# Patient Record
Sex: Female | Born: 1965 | Race: White | Hispanic: No | Marital: Married | Smoking: Never smoker
Health system: Southern US, Community
[De-identification: ages and names within clinical notes are randomized; demographics above are authoritative.]

## PROBLEM LIST (undated history)

## (undated) DIAGNOSIS — T7840XA Allergy, unspecified, initial encounter: Secondary | ICD-10-CM

## (undated) DIAGNOSIS — J45909 Unspecified asthma, uncomplicated: Secondary | ICD-10-CM

## (undated) HISTORY — DX: Allergy, unspecified, initial encounter: T78.40XA

## (undated) HISTORY — PX: DILATION AND CURETTAGE OF UTERUS: SHX78

## (undated) HISTORY — DX: Unspecified asthma, uncomplicated: J45.909

---

## 2000-12-19 ENCOUNTER — Other Ambulatory Visit: Admission: RE | Admit: 2000-12-19 | Discharge: 2000-12-19 | Payer: Self-pay | Admitting: Obstetrics and Gynecology

## 2001-12-24 ENCOUNTER — Other Ambulatory Visit: Admission: RE | Admit: 2001-12-24 | Discharge: 2001-12-24 | Payer: Self-pay | Admitting: Obstetrics and Gynecology

## 2002-07-29 ENCOUNTER — Other Ambulatory Visit: Admission: RE | Admit: 2002-07-29 | Discharge: 2002-07-29 | Payer: Self-pay | Admitting: Obstetrics and Gynecology

## 2002-12-03 ENCOUNTER — Encounter: Admission: RE | Admit: 2002-12-03 | Discharge: 2002-12-03 | Payer: Self-pay | Admitting: Obstetrics and Gynecology

## 2002-12-03 ENCOUNTER — Encounter: Payer: Self-pay | Admitting: Obstetrics and Gynecology

## 2003-01-27 ENCOUNTER — Other Ambulatory Visit: Admission: RE | Admit: 2003-01-27 | Discharge: 2003-01-27 | Payer: Self-pay | Admitting: Obstetrics and Gynecology

## 2005-02-18 ENCOUNTER — Encounter: Admission: RE | Admit: 2005-02-18 | Discharge: 2005-05-19 | Payer: Self-pay | Admitting: Psychiatry

## 2005-04-11 ENCOUNTER — Emergency Department: Payer: Self-pay | Admitting: Emergency Medicine

## 2007-10-08 ENCOUNTER — Encounter: Admission: RE | Admit: 2007-10-08 | Discharge: 2007-10-08 | Payer: Self-pay | Admitting: Obstetrics and Gynecology

## 2008-10-28 ENCOUNTER — Encounter: Admission: RE | Admit: 2008-10-28 | Discharge: 2008-10-28 | Payer: Self-pay | Admitting: Obstetrics and Gynecology

## 2009-11-02 ENCOUNTER — Encounter: Admission: RE | Admit: 2009-11-02 | Discharge: 2009-11-02 | Payer: Self-pay | Admitting: Obstetrics and Gynecology

## 2009-11-09 ENCOUNTER — Encounter: Admission: RE | Admit: 2009-11-09 | Discharge: 2009-11-09 | Payer: Self-pay | Admitting: Obstetrics and Gynecology

## 2010-09-26 ENCOUNTER — Encounter: Payer: Self-pay | Admitting: Obstetrics and Gynecology

## 2010-10-14 ENCOUNTER — Other Ambulatory Visit: Payer: Self-pay | Admitting: Obstetrics & Gynecology

## 2010-10-14 DIAGNOSIS — Z1231 Encounter for screening mammogram for malignant neoplasm of breast: Secondary | ICD-10-CM

## 2010-11-08 ENCOUNTER — Ambulatory Visit
Admission: RE | Admit: 2010-11-08 | Discharge: 2010-11-08 | Disposition: A | Discharge status: Home / Self Care | Payer: BC Managed Care – PPO | Source: Ambulatory Visit | Attending: Obstetrics & Gynecology | Admitting: Obstetrics & Gynecology

## 2010-11-08 DIAGNOSIS — Z1231 Encounter for screening mammogram for malignant neoplasm of breast: Secondary | ICD-10-CM

## 2011-10-17 ENCOUNTER — Other Ambulatory Visit: Payer: Self-pay | Admitting: Obstetrics & Gynecology

## 2011-10-17 DIAGNOSIS — Z1231 Encounter for screening mammogram for malignant neoplasm of breast: Secondary | ICD-10-CM

## 2011-11-11 ENCOUNTER — Ambulatory Visit
Admission: RE | Admit: 2011-11-11 | Discharge: 2011-11-11 | Disposition: A | Discharge status: Home / Self Care | Payer: BC Managed Care – PPO | Source: Ambulatory Visit | Attending: Obstetrics & Gynecology | Admitting: Obstetrics & Gynecology

## 2011-11-11 DIAGNOSIS — Z1231 Encounter for screening mammogram for malignant neoplasm of breast: Secondary | ICD-10-CM

## 2012-10-08 ENCOUNTER — Other Ambulatory Visit: Payer: Self-pay | Admitting: Obstetrics & Gynecology

## 2012-10-08 DIAGNOSIS — Z1231 Encounter for screening mammogram for malignant neoplasm of breast: Secondary | ICD-10-CM

## 2012-11-12 ENCOUNTER — Ambulatory Visit
Admission: RE | Admit: 2012-11-12 | Discharge: 2012-11-12 | Disposition: A | Discharge status: Home / Self Care | Payer: BC Managed Care – PPO | Source: Ambulatory Visit | Attending: Obstetrics & Gynecology | Admitting: Obstetrics & Gynecology

## 2012-11-12 DIAGNOSIS — Z1231 Encounter for screening mammogram for malignant neoplasm of breast: Secondary | ICD-10-CM

## 2013-10-10 ENCOUNTER — Other Ambulatory Visit: Payer: Self-pay

## 2013-10-10 DIAGNOSIS — Z1231 Encounter for screening mammogram for malignant neoplasm of breast: Secondary | ICD-10-CM

## 2013-11-27 ENCOUNTER — Ambulatory Visit
Admission: RE | Admit: 2013-11-27 | Discharge: 2013-11-27 | Disposition: A | Discharge status: Home / Self Care | Payer: Self-pay | Source: Ambulatory Visit

## 2013-11-27 DIAGNOSIS — Z1231 Encounter for screening mammogram for malignant neoplasm of breast: Secondary | ICD-10-CM

## 2014-10-21 ENCOUNTER — Other Ambulatory Visit: Payer: Self-pay

## 2014-10-21 DIAGNOSIS — Z1231 Encounter for screening mammogram for malignant neoplasm of breast: Secondary | ICD-10-CM

## 2014-12-10 ENCOUNTER — Ambulatory Visit
Admission: RE | Admit: 2014-12-10 | Discharge: 2014-12-10 | Disposition: A | Discharge status: Home / Self Care | Payer: Self-pay | Source: Ambulatory Visit

## 2014-12-10 DIAGNOSIS — Z1231 Encounter for screening mammogram for malignant neoplasm of breast: Secondary | ICD-10-CM

## 2015-08-28 ENCOUNTER — Ambulatory Visit (INDEPENDENT_AMBULATORY_CARE_PROVIDER_SITE_OTHER): Payer: BLUE CROSS/BLUE SHIELD | Admitting: Physician Assistant

## 2015-08-28 VITALS — BP 132/88 | HR 119 | Temp 97.8°F | Resp 18 | Ht 67.0 in | Wt 233.9 lb

## 2015-08-28 DIAGNOSIS — J4520 Mild intermittent asthma, uncomplicated: Secondary | ICD-10-CM

## 2015-08-28 DIAGNOSIS — J0110 Acute frontal sinusitis, unspecified: Secondary | ICD-10-CM

## 2015-08-28 MED ORDER — ALBUTEROL SULFATE HFA 108 (90 BASE) MCG/ACT IN AERS: 2.0000 | INHALATION_SPRAY | RESPIRATORY_TRACT | Status: AC | PRN

## 2015-08-28 MED ORDER — AMOXICILLIN-POT CLAVULANATE 875-125 MG PO TABS: 1.0000 | ORAL_TABLET | Freq: Two times a day (BID) | ORAL | Status: DC

## 2015-08-28 NOTE — Progress Notes (Signed)
   Subjective:    Patient ID: Shannon DaftElizabeth Cissell, female    DOB: Oct 10, 1965, 49 y.o.   MRN: [id number]  Chief Complaint  Patient presents with  . chest congestion  . Sore Throat  . Cough   Medications, allergies, past medical history, surgical history, family history, social history and problem list reviewed and updated.  HPI  Symptoms started 11 days ago with head congestion, st, and mild cough. Persistent since onset. Taking mucinex and sudafed without relief. Head congestion worsened past 48 hours. Mild bilateral frontal HA past 3-4 days. Chills at home but no fevers.   Has mild intermittent asthma (inhaler with allergies or exercise), would like albuterol refill.   Review of Systems See HPI     Objective:   Physical Exam  Constitutional: She appears well-developed and well-nourished.  Non-toxic appearance. She does not have a sickly appearance. She does not appear ill. No distress.  BP 132/88 mmHg  Pulse 119  Temp(Src) 97.8 F (36.6 C) (Oral)  Resp 18  Ht 5\' 7"  (1.702 m)  Wt 233 lb 14.4 oz (106.096 kg)  BMI 36.63 kg/m2  SpO2 98%  LMP 08/10/2015   HENT:  Right Ear: Tympanic membrane normal.  Left Ear: Tympanic membrane normal.  Nose: Mucosal edema and rhinorrhea present. Right sinus exhibits frontal sinus tenderness. Right sinus exhibits no maxillary sinus tenderness. Left sinus exhibits frontal sinus tenderness. Left sinus exhibits no maxillary sinus tenderness.  Mouth/Throat: Uvula is midline, oropharynx is clear and moist and mucous membranes are normal.  Pulmonary/Chest: Effort normal and breath sounds normal. No tachypnea.  Lymphadenopathy:       Head (right side): Submandibular adenopathy present. No submental and no tonsillar adenopathy present.       Head (left side): Submandibular adenopathy present. No submental and no tonsillar adenopathy present.    She has no cervical adenopathy.      Assessment & Plan:   Acute frontal sinusitis, recurrence not specified  - Plan: amoxicillin-clavulanate (AUGMENTIN) 875-125 MG tablet  Asthma, mild intermittent, uncomplicated - Plan: albuterol (PROVENTIL HFA;VENTOLIN HFA) 108 (90 BASE) MCG/ACT inhaler --suspect bacterial etiology as symptoms present 11 days and worsened 2 days ago --augmentin 10 days bid --continue sudafed for congestion --filled albuterol for prn purposes  Donnajean Lopesodd M. Christapher Gillian, PA-C Physician Assistant-Certified [facility]  08/28/2015 9:54 AM

## 2015-08-28 NOTE — Patient Instructions (Signed)
Please take the augmentin twice daily for 10 days.  If you're not improved in 4-5 days or start having persistent fevers please come back to see us. I've filled your albuterol to use as needed.   Sinusitis, Adult Sinusitis is redness, soreness, and inflammation of the paranasal sinuses. Paranasal sinuses are air pockets within the bones of your face. They are located beneath your eyes, in the middle of your forehead, and above your eyes. In healthy paranasal sinuses, mucus is able to drain out, and air is able to circulate through them by way of your nose. However, when your paranasal sinuses are inflamed, mucus and air can become trapped. This can allow bacteria and other germs to grow and cause infection. Sinusitis can develop quickly and last only a short time (acute) or continue over a long period (chronic). Sinusitis that lasts for more than 12 weeks is considered chronic. CAUSES Causes of sinusitis include:  Allergies.  Structural abnormalities, such as displacement of the cartilage that separates your nostrils (deviated septum), which can decrease the air flow through your nose and sinuses and affect sinus drainage.  Functional abnormalities, such as when the small hairs (cilia) that line your sinuses and help remove mucus do not work properly or are not present. SIGNS AND SYMPTOMS Symptoms of acute and chronic sinusitis are the same. The primary symptoms are pain and pressure around the affected sinuses. Other symptoms include:  Upper toothache.  Earache.  Headache.  Bad breath.  Decreased sense of smell and taste.  A cough, which worsens when you are lying flat.  Fatigue.  Fever.  Thick drainage from your nose, which often is green and may contain pus (purulent).  Swelling and warmth over the affected sinuses. DIAGNOSIS Your health care provider will perform a physical exam. During your exam, your health care provider may perform any of the following to help determine  if you have acute sinusitis or chronic sinusitis:  Look in your nose for signs of abnormal growths in your nostrils (nasal polyps).  Tap over the affected sinus to check for signs of infection.  View the inside of your sinuses using an imaging device that has a light attached (endoscope). If your health care provider suspects that you have chronic sinusitis, one or more of the following tests may be recommended:  Allergy tests.  Nasal culture. A sample of mucus is taken from your nose, sent to a lab, and screened for bacteria.  Nasal cytology. A sample of mucus is taken from your nose and examined by your health care provider to determine if your sinusitis is related to an allergy. TREATMENT Most cases of acute sinusitis are related to a viral infection and will resolve on their own within 10 days. Sometimes, medicines are prescribed to help relieve symptoms of both acute and chronic sinusitis. These may include pain medicines, decongestants, nasal steroid sprays, or saline sprays. However, for sinusitis related to a bacterial infection, your health care provider will prescribe antibiotic medicines. These are medicines that will help kill the bacteria causing the infection. Rarely, sinusitis is caused by a fungal infection. In these cases, your health care provider will prescribe antifungal medicine. For some cases of chronic sinusitis, surgery is needed. Generally, these are cases in which sinusitis recurs more than 3 times per year, despite other treatments. HOME CARE INSTRUCTIONS  Drink plenty of water. Water helps thin the mucus so your sinuses can drain more easily.  Use a humidifier.  Inhale steam 3-4 times a day (  for example, sit in the bathroom with the shower running).  Apply a warm, moist washcloth to your face 3-4 times a day, or as directed by your health care provider.  Use saline nasal sprays to help moisten and clean your sinuses.  Take medicines only as directed by your  health care provider.  If you were prescribed either an antibiotic or antifungal medicine, finish it all even if you start to feel better. SEEK IMMEDIATE MEDICAL CARE IF:  You have increasing pain or severe headaches.  You have nausea, vomiting, or drowsiness.  You have swelling around your face.  You have vision problems.  You have a stiff neck.  You have difficulty breathing.   This information is not intended to replace advice given to you by your health care provider. Make sure you discuss any questions you have with your health care provider.   Document Released: 08/22/2005 Document Revised: 09/12/2014 Document Reviewed: 09/06/2011 Elsevier Interactive Patient Education [employer].

## 2015-09-09 ENCOUNTER — Other Ambulatory Visit: Payer: Self-pay

## 2015-09-09 DIAGNOSIS — J0110 Acute frontal sinusitis, unspecified: Secondary | ICD-10-CM

## 2015-09-09 NOTE — Telephone Encounter (Signed)
Pt states she is still stuffy and would like to have another refill on the AUGMENTIN 875-125 MG. Please call [phone]    [facility] [address] [place]

## 2015-09-10 NOTE — Telephone Encounter (Signed)
This should have resolved infection.  This can be allergies, or simply inflammation.  She would need to be seen, and re-evaluated or to get feedback from provider who saw her for this--which is not here regularly.  So, she needs to come in.

## 2015-09-10 NOTE — Telephone Encounter (Signed)
Pt came into 102 to check status of her request. She gave further details that the Augmentin worked very well for her and she was totally improved by the time she finished round on Sunday. She reports that she started to get her sinus symptoms back on Monday and feels that she just needed a longer round. Can we get her a refill for how ever many more days would be appropriate?

## 2015-09-15 NOTE — Telephone Encounter (Signed)
Left message advised note from [employer] [employer].

## 2015-11-02 ENCOUNTER — Other Ambulatory Visit: Payer: Self-pay

## 2015-11-02 DIAGNOSIS — Z1231 Encounter for screening mammogram for malignant neoplasm of breast: Secondary | ICD-10-CM

## 2015-12-16 ENCOUNTER — Ambulatory Visit: Payer: BLUE CROSS/BLUE SHIELD

## 2015-12-23 ENCOUNTER — Ambulatory Visit
Admission: RE | Admit: 2015-12-23 | Discharge: 2015-12-23 | Disposition: A | Discharge status: Home / Self Care | Payer: BLUE CROSS/BLUE SHIELD | Source: Ambulatory Visit

## 2015-12-23 DIAGNOSIS — Z1231 Encounter for screening mammogram for malignant neoplasm of breast: Secondary | ICD-10-CM

## 2016-01-27 DIAGNOSIS — E669 Obesity, unspecified: Secondary | ICD-10-CM | POA: Insufficient documentation

## 2016-11-14 ENCOUNTER — Other Ambulatory Visit: Payer: Self-pay | Admitting: Obstetrics & Gynecology

## 2016-11-14 DIAGNOSIS — Z1231 Encounter for screening mammogram for malignant neoplasm of breast: Secondary | ICD-10-CM

## 2016-12-28 ENCOUNTER — Ambulatory Visit: Payer: BLUE CROSS/BLUE SHIELD

## 2017-01-18 ENCOUNTER — Ambulatory Visit
Admission: RE | Admit: 2017-01-18 | Discharge: 2017-01-18 | Disposition: A | Discharge status: Home / Self Care | Payer: BLUE CROSS/BLUE SHIELD | Source: Ambulatory Visit | Attending: Obstetrics & Gynecology | Admitting: Obstetrics & Gynecology

## 2017-01-18 DIAGNOSIS — Z1231 Encounter for screening mammogram for malignant neoplasm of breast: Secondary | ICD-10-CM

## 2017-01-19 ENCOUNTER — Other Ambulatory Visit: Payer: Self-pay | Admitting: Obstetrics & Gynecology

## 2017-01-19 DIAGNOSIS — R928 Other abnormal and inconclusive findings on diagnostic imaging of breast: Secondary | ICD-10-CM

## 2017-01-24 ENCOUNTER — Ambulatory Visit
Admission: RE | Admit: 2017-01-24 | Discharge: 2017-01-24 | Disposition: A | Discharge status: Home / Self Care | Payer: BLUE CROSS/BLUE SHIELD | Source: Ambulatory Visit | Attending: Obstetrics & Gynecology | Admitting: Obstetrics & Gynecology

## 2017-01-24 DIAGNOSIS — R928 Other abnormal and inconclusive findings on diagnostic imaging of breast: Secondary | ICD-10-CM

## 2017-12-19 ENCOUNTER — Other Ambulatory Visit: Payer: Self-pay | Admitting: Obstetrics & Gynecology

## 2017-12-19 DIAGNOSIS — Z1231 Encounter for screening mammogram for malignant neoplasm of breast: Secondary | ICD-10-CM

## 2018-01-24 ENCOUNTER — Ambulatory Visit
Admission: RE | Admit: 2018-01-24 | Discharge: 2018-01-24 | Disposition: A | Discharge status: Home / Self Care | Payer: BLUE CROSS/BLUE SHIELD | Source: Ambulatory Visit | Attending: Obstetrics & Gynecology | Admitting: Obstetrics & Gynecology

## 2018-01-24 DIAGNOSIS — Z1231 Encounter for screening mammogram for malignant neoplasm of breast: Secondary | ICD-10-CM

## 2018-03-18 ENCOUNTER — Other Ambulatory Visit: Payer: Self-pay

## 2018-03-18 DIAGNOSIS — K5732 Diverticulitis of large intestine without perforation or abscess without bleeding: Secondary | ICD-10-CM | POA: Diagnosis not present

## 2018-03-18 DIAGNOSIS — Z79899 Other long term (current) drug therapy: Secondary | ICD-10-CM | POA: Diagnosis not present

## 2018-03-18 DIAGNOSIS — J45909 Unspecified asthma, uncomplicated: Secondary | ICD-10-CM | POA: Diagnosis not present

## 2018-03-18 DIAGNOSIS — R1032 Left lower quadrant pain: Secondary | ICD-10-CM | POA: Diagnosis present

## 2018-03-18 LAB — URINALYSIS, COMPLETE (UACMP) WITH MICROSCOPIC
BILIRUBIN URINE: NEGATIVE
Glucose, UA: NEGATIVE mg/dL
Hgb urine dipstick: NEGATIVE
KETONES UR: NEGATIVE mg/dL
Leukocytes, UA: NEGATIVE
NITRITE: NEGATIVE
PROTEIN: NEGATIVE mg/dL
Specific Gravity, Urine: 1.004 — ABNORMAL LOW (ref 1.005–1.030)
WBC UA: NONE SEEN WBC/hpf (ref 0–5)
pH: 6 (ref 5.0–8.0)

## 2018-03-18 LAB — CBC
HEMATOCRIT: 42.8 % (ref 35.0–47.0)
HEMOGLOBIN: 14.4 g/dL (ref 12.0–16.0)
MCH: 28.8 pg (ref 26.0–34.0)
MCHC: 33.7 g/dL (ref 32.0–36.0)
MCV: 85.6 fL (ref 80.0–100.0)
Platelets: 326 10*3/uL (ref 150–440)
RBC: 5 MIL/uL (ref 3.80–5.20)
RDW: 13.7 % (ref 11.5–14.5)
WBC: 14.4 10*3/uL — AB (ref 3.6–11.0)

## 2018-03-18 LAB — LIPASE, BLOOD: Lipase: 33 U/L (ref 11–51)

## 2018-03-18 LAB — COMPREHENSIVE METABOLIC PANEL
ALT: 25 U/L (ref 0–44)
ANION GAP: 8 (ref 5–15)
AST: 18 U/L (ref 15–41)
Albumin: 4.2 g/dL (ref 3.5–5.0)
Alkaline Phosphatase: 80 U/L (ref 38–126)
BUN: 12 mg/dL (ref 6–20)
CALCIUM: 9.2 mg/dL (ref 8.9–10.3)
CO2: 26 mmol/L (ref 22–32)
Chloride: 104 mmol/L (ref 98–111)
Creatinine, Ser: 0.58 mg/dL (ref 0.44–1.00)
Glucose, Bld: 107 mg/dL — ABNORMAL HIGH (ref 70–99)
Potassium: 3.8 mmol/L (ref 3.5–5.1)
Sodium: 138 mmol/L (ref 135–145)
TOTAL PROTEIN: 7.8 g/dL (ref 6.5–8.1)
Total Bilirubin: 0.6 mg/dL (ref 0.3–1.2)

## 2018-03-18 NOTE — ED Triage Notes (Signed)
Pt alert, oriented, ambulatory. States LLQ pain x 1 day with "low grade fever this afternoon." denies N&V&D. States thought it was gas "but it hasn't shifted." denies urinary symptoms. Denies hematuria. Still having periods, last menstrual was 2 weeks ago.

## 2018-03-19 ENCOUNTER — Emergency Department
Admission: EM | Admit: 2018-03-19 | Discharge: 2018-03-19 | Disposition: A | Discharge status: Home / Self Care | Payer: BLUE CROSS/BLUE SHIELD | Attending: Emergency Medicine | Admitting: Emergency Medicine

## 2018-03-19 ENCOUNTER — Emergency Department: Payer: BLUE CROSS/BLUE SHIELD

## 2018-03-19 ENCOUNTER — Encounter: Payer: Self-pay | Admitting: Radiology

## 2018-03-19 DIAGNOSIS — K5792 Diverticulitis of intestine, part unspecified, without perforation or abscess without bleeding: Secondary | ICD-10-CM

## 2018-03-19 LAB — PREGNANCY, URINE: Preg Test, Ur: NEGATIVE

## 2018-03-19 MED ORDER — OXYCODONE-ACETAMINOPHEN 5-325 MG PO TABS: 2.0000 | ORAL_TABLET | ORAL | 0 refills | Status: DC | PRN

## 2018-03-19 MED ORDER — CIPROFLOXACIN HCL 500 MG PO TABS: 500.0000 mg | ORAL_TABLET | Freq: Two times a day (BID) | ORAL | 0 refills | Status: AC

## 2018-03-19 MED ORDER — METRONIDAZOLE 500 MG PO TABS
500.0000 mg | ORAL_TABLET | Freq: Once | ORAL | Status: AC
  Administered 2018-03-19: 500 mg via ORAL
  Filled 2018-03-19: qty 1

## 2018-03-19 MED ORDER — IOHEXOL 300 MG/ML  SOLN
100.0000 mL | Freq: Once | INTRAMUSCULAR | Status: AC | PRN
  Administered 2018-03-19: 100 mL via INTRAVENOUS

## 2018-03-19 MED ORDER — ONDANSETRON 4 MG PO TBDP: 4.0000 mg | ORAL_TABLET | Freq: Three times a day (TID) | ORAL | 0 refills | Status: DC | PRN

## 2018-03-19 MED ORDER — CIPROFLOXACIN IN D5W 400 MG/200ML IV SOLN
400.0000 mg | Freq: Once | INTRAVENOUS | Status: AC
  Administered 2018-03-19: 400 mg via INTRAVENOUS
  Filled 2018-03-19: qty 200

## 2018-03-19 MED ORDER — METRONIDAZOLE 500 MG PO TABS: 500.0000 mg | ORAL_TABLET | Freq: Two times a day (BID) | ORAL | 0 refills | Status: AC

## 2018-03-19 NOTE — ED Notes (Signed)
Pt reports having left lower abdominal pain which started on yesterday afternoon. Pt thought it was gas but she never passed any gas. She also had a low grade fever of 100.2. Pt has also developed a HA. Pt is alert and oriented x 4. Family at bedside.

## 2018-03-19 NOTE — ED Notes (Signed)
Pt says she is currently pain-free but has pain to lower abd when she yawns or stretches out her torso; husband at bedside;

## 2018-03-19 NOTE — Discharge Instructions (Signed)
Please follow-up with your primary care physician for further evaluation of your symptoms.  Please return with any worsening conditions or any other concerns. °

## 2018-03-19 NOTE — ED Provider Notes (Signed)
[facility] Emergency Department Provider Note   ____________________________________________   First MD Initiated Contact with Patient 03/19/18 [phone]     (approximate)  I have reviewed the triage vital signs and the nursing notes.   HISTORY  Chief Complaint Abdominal Pain    HPI Shannon Moreno is a 52 y.o. female who comes into the hospital today with some left lower quadrant abdominal pain.  The patient states that has been going on for about 24 hours and she has had some low-grade temperatures.  The patient's temperature at home as well as here was 100.2.  She denies any pain with urination and denies any diarrhea or constipation.  She has not had any nausea or vomiting.  The patient has never had these symptoms before.  She took 2 Tylenol for her headache but it did not help her abdominal pain.  Currently the patient's pain is a 2 out of 10 in intensity.  The patient is here for evaluation of her symptoms.   Past Medical History:  Diagnosis Date  . Allergy   . Asthma     There are no active problems to display for this patient.   Past Surgical History:  Procedure Laterality Date  . DILATION AND CURETTAGE OF UTERUS      Prior to Admission medications   Medication Sig Start Date End Date Taking? Authorizing Provider  albuterol (PROVENTIL HFA;VENTOLIN HFA) 108 (90 BASE) MCG/ACT inhaler Inhale 2 puffs into the lungs every 4 (four) hours as needed for wheezing or shortness of breath (cough, shortness of breath or wheezing.). 08/28/15   McVeigh, Tawanna Coolerodd, PA  amoxicillin-clavulanate (AUGMENTIN) 875-125 MG tablet Take 1 tablet by mouth 2 (two) times daily. 08/28/15   McVeigh, Tawanna Coolerodd, PA  ciprofloxacin (CIPRO) 500 MG tablet Take 1 tablet (500 mg total) by mouth 2 (two) times daily for 7 days. 03/19/18 03/26/18  Rebecka ApleyWebster, Deedra Pro P, MD  metroNIDAZOLE (FLAGYL) 500 MG tablet Take 1 tablet (500 mg total) by mouth 2 (two) times daily for 7 days. 03/19/18 03/26/18   Rebecka ApleyWebster, Jaremy Nosal P, MD  ondansetron (ZOFRAN ODT) 4 MG disintegrating tablet Take 1 tablet (4 mg total) by mouth every 8 (eight) hours as needed for nausea or vomiting. 03/19/18   Rebecka ApleyWebster, Siddhanth Denk P, MD  oxyCODONE-acetaminophen (PERCOCET/ROXICET) 5-325 MG tablet Take 2 tablets by mouth every 4 (four) hours as needed for severe pain. 03/19/18   Rebecka ApleyWebster, Charday Capetillo P, MD    Allergies Dust mite extract  Family History  Problem Relation Age of Onset  . Heart disease Father   . Cancer Maternal Grandmother   . Cancer Maternal Grandfather   . Breast cancer Maternal Aunt     Social History Social History   Tobacco Use  . Smoking status: Never Smoker  Substance Use Topics  . Alcohol use: Yes    Alcohol/week: 0.0 oz  . Drug use: Not on file    Review of Systems  Constitutional: fever Eyes: No visual changes. ENT: No sore throat. Cardiovascular: Denies chest pain. Respiratory: Denies shortness of breath. Gastrointestinal:  abdominal pain.  No nausea, no vomiting.  No diarrhea.  No constipation. Genitourinary: Negative for dysuria. Musculoskeletal: Negative for back pain. Skin: Negative for rash. Neurological: Negative for headaches, focal weakness or numbness.   ____________________________________________   PHYSICAL EXAM:  VITAL SIGNS: ED Triage Vitals [03/18/18 1844]  Enc Vitals Group     BP (!) 154/87     Pulse Rate 95     Resp 16  Temp 100.2 F (37.9 C)     Temp Source Oral     SpO2 95 %     Weight 240 lb (108.9 kg)     Height 5\' 8"  (1.727 m)     Head Circumference      Peak Flow      Pain Score 3     Pain Loc      Pain Edu?      Excl. in GC?     Constitutional: Alert and oriented. Well appearing and in mild distress. Eyes: Conjunctivae are normal. PERRL. EOMI. Head: Atraumatic. Nose: No congestion/rhinnorhea. Mouth/Throat: Mucous membranes are moist.  Oropharynx non-erythematous. Cardiovascular: Normal rate, regular rhythm. Grossly normal heart sounds.   Good peripheral circulation. Respiratory: Normal respiratory effort.  No retractions. Lungs CTAB. Gastrointestinal: Soft and nontender. No distention.  Positive bowel sounds Musculoskeletal: No lower extremity tenderness nor edema.   Neurologic:  Normal speech and language.  Skin:  Skin is warm, dry and intact.  Psychiatric: Mood and affect are normal.   ____________________________________________   LABS (all labs ordered are listed, but only abnormal results are displayed)  Labs Reviewed  COMPREHENSIVE METABOLIC PANEL - Abnormal; Notable for the following components:      Result Value   Glucose, Bld 107 (*)    All other components within normal limits  CBC - Abnormal; Notable for the following components:   WBC 14.4 (*)    All other components within normal limits  URINALYSIS, COMPLETE (UACMP) WITH MICROSCOPIC - Abnormal; Notable for the following components:   Color, Urine STRAW (*)    APPearance CLEAR (*)    Specific Gravity, Urine 1.004 (*)    Bacteria, UA RARE (*)    All other components within normal limits  LIPASE, BLOOD  PREGNANCY, URINE   ____________________________________________  EKG  none ____________________________________________  RADIOLOGY  ED MD interpretation: CT abdomen and pelvis: Diverticulitis of the proximal descending colon, no diverticular abscess or perforation, no bowel obstruction, normal appendix.  Official radiology report(s): Ct Abdomen Pelvis W Contrast  Result Date: 03/19/2018 CLINICAL DATA:  52 year old female with abdominal pain. EXAM: CT ABDOMEN AND PELVIS WITH CONTRAST TECHNIQUE: Multidetector CT imaging of the abdomen and pelvis was performed using the standard protocol following bolus administration of intravenous contrast. CONTRAST:  OMNIPAQUE IOHEXOL 300 MG/ML  SOLN COMPARISON:  None. FINDINGS: Lower chest: The visualized lung bases are clear. No intra-abdominal free air or free fluid. Hepatobiliary: Subcentimeter left  hepatic hypodense lesion is too small to characterize. The liver is otherwise unremarkable. No intrahepatic biliary duct dilatation. The gallbladder is unremarkable. Pancreas: Unremarkable. No pancreatic ductal dilatation or surrounding inflammatory changes. Spleen: Normal in size without focal abnormality. Adrenals/Urinary Tract: The adrenal glands are unremarkable. There is no hydronephrosis on either side. There is symmetric enhancement and excretion of contrast by both kidneys. The visualized ureters and urinary bladder appear unremarkable. Stomach/Bowel: There is colonic diverticulosis primarily involving the descending and sigmoid colon. There is inflammatory changes and segmental thickening centered around a diverticulum in the proximal descending colon most consistent with acute diverticulitis. Underlying mass is not entirely excluded. There is no diverticular abscess or perforation. There is moderate colonic stool burden. No bowel obstruction. The appendix is normal. Vascular/Lymphatic: The abdominal aorta and IVC appear unremarkable. No portal venous gas. There is no adenopathy. Reproductive: The uterus is anteverted. Probable arcuate or septate morphology. Ultrasound may provide better evaluation. The ovaries appear unremarkable as well. No pelvic mass. Other: None Musculoskeletal: Degenerative changes primarily at  L5-S1. No acute osseous pathology. IMPRESSION: Diverticulitis of the proximal descending colon. No diverticular abscess or perforation. No bowel obstruction. Normal appendix. Electronically Signed   By: Elgie Collard M.D.   On: 03/19/2018 03:14    ____________________________________________   PROCEDURES  Procedure(s) performed: None  Procedures  Critical Care performed: No  ____________________________________________   INITIAL IMPRESSION / ASSESSMENT AND PLAN / ED COURSE  As part of my medical decision making, I reviewed the following data within the electronic medical  record:  Notes from prior ED visits and [place] Controlled Substance Database   This is a 52 year old female who comes into the hospital today with some left lower quadrant abdominal pain.  My differential diagnosis includes diverticulitis, colitis, abdominal pain not otherwise specified.  We did check some blood work on the patient to include a CBC, CMP, urinalysis and a lipase.  The patient does have a white blood cell count of 14.4. I did send the patient for a CT scan of her abdomen and pelvis and it was found that the patient does have diverticulitis.  I gave her a dose of ciprofloxacin IV and some oral Flagyl.  Since the patient is not vomiting and her pain is improved I feel that the patient should be able to manage her symptoms at home.  I discussed this with the patient and she agrees with the plan.  Her pain is still controlled and she is not having any vomiting.  The patient will be discharged home.      ____________________________________________   FINAL CLINICAL IMPRESSION(S) / ED DIAGNOSES  Final diagnoses:  Diverticulitis     ED Discharge Orders        Ordered    ondansetron (ZOFRAN ODT) 4 MG disintegrating tablet  Every 8 hours PRN     03/19/18 0355    oxyCODONE-acetaminophen (PERCOCET/ROXICET) 5-325 MG tablet  Every 4 hours PRN     03/19/18 0355    ciprofloxacin (CIPRO) 500 MG tablet  2 times daily     03/19/18 0355    metroNIDAZOLE (FLAGYL) 500 MG tablet  2 times daily     03/19/18 0355       Note:  This document was prepared using Dragon voice recognition software and may include unintentional dictation errors.   Rebecka Apley, MD 03/19/18 [phone]

## 2019-03-12 ENCOUNTER — Other Ambulatory Visit: Payer: Self-pay | Admitting: Obstetrics & Gynecology

## 2019-03-12 DIAGNOSIS — Z1231 Encounter for screening mammogram for malignant neoplasm of breast: Secondary | ICD-10-CM

## 2019-04-24 ENCOUNTER — Ambulatory Visit
Admission: RE | Admit: 2019-04-24 | Discharge: 2019-04-24 | Disposition: A | Discharge status: Home / Self Care | Payer: BLUE CROSS/BLUE SHIELD | Source: Ambulatory Visit | Attending: Obstetrics & Gynecology | Admitting: Obstetrics & Gynecology

## 2019-04-24 ENCOUNTER — Other Ambulatory Visit: Payer: Self-pay

## 2019-04-24 DIAGNOSIS — Z1231 Encounter for screening mammogram for malignant neoplasm of breast: Secondary | ICD-10-CM

## 2019-11-20 ENCOUNTER — Other Ambulatory Visit: Payer: Self-pay

## 2019-11-20 ENCOUNTER — Ambulatory Visit (INDEPENDENT_AMBULATORY_CARE_PROVIDER_SITE_OTHER): Payer: BC Managed Care – PPO

## 2019-11-20 ENCOUNTER — Ambulatory Visit (INDEPENDENT_AMBULATORY_CARE_PROVIDER_SITE_OTHER): Payer: BC Managed Care – PPO | Admitting: Podiatry

## 2019-11-20 ENCOUNTER — Encounter: Payer: Self-pay | Admitting: Podiatry

## 2019-11-20 DIAGNOSIS — M722 Plantar fascial fibromatosis: Secondary | ICD-10-CM

## 2019-11-20 DIAGNOSIS — J45909 Unspecified asthma, uncomplicated: Secondary | ICD-10-CM | POA: Insufficient documentation

## 2019-11-20 MED ORDER — METHYLPREDNISOLONE 4 MG PO TBPK: ORAL_TABLET | ORAL | 0 refills | Status: DC

## 2019-11-20 MED ORDER — MELOXICAM 15 MG PO TABS: 15.0000 mg | ORAL_TABLET | Freq: Every day | ORAL | 3 refills | Status: AC

## 2019-11-20 NOTE — Progress Notes (Signed)
  Subjective:  Patient ID: Shannon Moreno, female    DOB: 04-Jun-1966,  MRN: [id number] HPI Chief Complaint  Patient presents with  . Foot Pain    Plantar heel left - aching x 1 month, AM pain, worse after at work all day, tried naproxen and Tylenol, wears orthotics made here 5 years ago, tried night boot, soaking, aspercreme-no help  . New Patient (Initial Visit)    54 y.o. female presents with the above complaint.   ROS: She denies fever chills nausea vomiting muscle aches pains calf pain back pain chest pain shortness of breath.  Past Medical History:  Diagnosis Date  . Allergy   . Asthma    Past Surgical History:  Procedure Laterality Date  . DILATION AND CURETTAGE OF UTERUS      Current Outpatient Medications:  .  acetaminophen (TYLENOL) 325 MG tablet, Take by mouth., Disp: , Rfl:  .  albuterol (PROVENTIL HFA;VENTOLIN HFA) 108 (90 BASE) MCG/ACT inhaler, Inhale 2 puffs into the lungs every 4 (four) hours as needed for wheezing or shortness of breath (cough, shortness of breath or wheezing.)., Disp: 1 Inhaler, Rfl: 1 .  meloxicam (MOBIC) 15 MG tablet, Take 1 tablet (15 mg total) by mouth daily., Disp: 30 tablet, Rfl: 3 .  methylPREDNISolone (MEDROL DOSEPAK) 4 MG TBPK tablet, 6 day dose pack - take as directed, Disp: 21 tablet, Rfl: 0  Allergies  Allergen Reactions  . Dust Mite Extract Other (See Comments)    "dust" per patient, causes respiratory symptoms   Review of Systems Objective:  There were no vitals filed for this visit.  General: Well developed, nourished, in no acute distress, alert and oriented x3   Dermatological: Skin is warm, dry and supple bilateral. Nails x 10 are well maintained; remaining integument appears unremarkable at this time. There are no open sores, no preulcerative lesions, no rash or signs of infection present.  Vascular: Dorsalis Pedis artery and Posterior Tibial artery pedal pulses are 2/4 bilateral with immedate capillary fill time. Pedal  hair growth present. No varicosities and no lower extremity edema present bilateral.   Neruologic: Grossly intact via light touch bilateral. Vibratory intact via tuning fork bilateral. Protective threshold with Semmes Wienstein monofilament intact to all pedal sites bilateral. Patellar and Achilles deep tendon reflexes 2+ bilateral. No Babinski or clonus noted bilateral.   Musculoskeletal: No gross boney pedal deformities bilateral. No pain, crepitus, or limitation noted with foot and ankle range of motion bilateral. Muscular strength 5/5 in all groups tested bilateral.  She has pain on palpation medial calcaneal tubercle of her left heel.  No pain on medial and lateral compression of the calcaneus.  Gait: Unassisted, Nonantalgic.    Radiographs:  Radiographs taken today demonstrate plantar distally oriented calcaneal heel spur left.  Osseously mature individual soft tissue increase in density plantar fascial cannula insertion site left heel.  Assessment & Plan:   Assessment: Plantar fasciitis left.  Plan: Discussed etiology pathology conservative versus surgical therapies.  Starting her on a Medrol Dosepak to be followed by meloxicam.  Injected her left heel today with Kenalog and local anesthetic.  She will continue to use the night splint and I placed her in a plantar fascial brace.  We discussed appropriate shoe gear stretching exercise ice therapy shoe gear modifications.  We will have her a new set of orthotics made when Raiford Noble returns from his knee replacement..  I will follow-up with her in 1 month     Rakhi Romagnoli T. [place], [place]

## 2019-11-20 NOTE — Patient Instructions (Signed)

## 2019-12-04 ENCOUNTER — Ambulatory Visit (INDEPENDENT_AMBULATORY_CARE_PROVIDER_SITE_OTHER): Payer: BC Managed Care – PPO | Admitting: Orthotics

## 2019-12-04 ENCOUNTER — Other Ambulatory Visit: Payer: Self-pay

## 2019-12-04 DIAGNOSIS — M722 Plantar fascial fibromatosis: Secondary | ICD-10-CM

## 2019-12-04 NOTE — Progress Notes (Signed)

## 2019-12-25 ENCOUNTER — Other Ambulatory Visit: Payer: BC Managed Care – PPO | Admitting: Orthotics

## 2019-12-25 ENCOUNTER — Ambulatory Visit (INDEPENDENT_AMBULATORY_CARE_PROVIDER_SITE_OTHER): Payer: BC Managed Care – PPO | Admitting: Podiatry

## 2019-12-25 ENCOUNTER — Other Ambulatory Visit: Payer: Self-pay

## 2019-12-25 ENCOUNTER — Encounter: Payer: Self-pay | Admitting: Podiatry

## 2019-12-25 DIAGNOSIS — M722 Plantar fascial fibromatosis: Secondary | ICD-10-CM

## 2019-12-25 NOTE — Progress Notes (Signed)
She presents today to pick up orthotics but they were not in.  She is also complaining of continued pain to her left heel which has resolved only by about 50%.  Objective: Vital signs are stable alert and oriented x3.  Pulses are palpable.  She has pain palpation medial calcaneal tubercle of the left heel.  Assessment: Plantar fasciitis with 50% resolved.  Plan: Continue meloxicam 15 mg 1 p.o. daily we will await the arrival of the orthotics.  And I will follow-up with her in 4 to 6 weeks.  We will call her once the orthotics come in.

## 2020-01-06 ENCOUNTER — Telehealth: Payer: Self-pay | Admitting: Podiatry

## 2020-01-06 NOTE — Telephone Encounter (Signed)
Pt left message Friday checking on status of orthotics that she was there to pick up on 4.21.2021 in [place] and they were not there.

## 2020-02-12 ENCOUNTER — Encounter: Payer: BC Managed Care – PPO | Admitting: Podiatry

## 2020-08-04 ENCOUNTER — Other Ambulatory Visit: Payer: Self-pay | Admitting: Obstetrics & Gynecology

## 2020-08-04 DIAGNOSIS — Z1231 Encounter for screening mammogram for malignant neoplasm of breast: Secondary | ICD-10-CM

## 2020-09-16 ENCOUNTER — Ambulatory Visit: Payer: BC Managed Care – PPO

## 2020-10-06 ENCOUNTER — Ambulatory Visit: Payer: BC Managed Care – PPO

## 2020-12-16 ENCOUNTER — Ambulatory Visit
Admission: RE | Admit: 2020-12-16 | Discharge: 2020-12-16 | Disposition: A | Discharge status: Home / Self Care | Payer: No Typology Code available for payment source | Source: Ambulatory Visit | Attending: Obstetrics & Gynecology | Admitting: Obstetrics & Gynecology

## 2020-12-16 ENCOUNTER — Other Ambulatory Visit: Payer: Self-pay

## 2020-12-16 DIAGNOSIS — Z1231 Encounter for screening mammogram for malignant neoplasm of breast: Secondary | ICD-10-CM | POA: Insufficient documentation

## 2021-11-03 DIAGNOSIS — H6692 Otitis media, unspecified, left ear: Secondary | ICD-10-CM | POA: Diagnosis not present

## 2021-11-10 DIAGNOSIS — H9202 Otalgia, left ear: Secondary | ICD-10-CM | POA: Diagnosis not present

## 2021-11-16 ENCOUNTER — Other Ambulatory Visit: Payer: Self-pay | Admitting: Obstetrics & Gynecology

## 2021-11-16 DIAGNOSIS — Z1231 Encounter for screening mammogram for malignant neoplasm of breast: Secondary | ICD-10-CM

## 2021-12-08 DIAGNOSIS — Z79890 Hormone replacement therapy (postmenopausal): Secondary | ICD-10-CM | POA: Diagnosis not present

## 2021-12-08 DIAGNOSIS — Z01411 Encounter for gynecological examination (general) (routine) with abnormal findings: Secondary | ICD-10-CM | POA: Diagnosis not present

## 2021-12-08 DIAGNOSIS — Z6841 Body Mass Index (BMI) 40.0 and over, adult: Secondary | ICD-10-CM | POA: Diagnosis not present

## 2021-12-08 DIAGNOSIS — Z124 Encounter for screening for malignant neoplasm of cervix: Secondary | ICD-10-CM | POA: Diagnosis not present

## 2021-12-08 DIAGNOSIS — N959 Unspecified menopausal and perimenopausal disorder: Secondary | ICD-10-CM | POA: Diagnosis not present

## 2021-12-08 DIAGNOSIS — Z01419 Encounter for gynecological examination (general) (routine) without abnormal findings: Secondary | ICD-10-CM | POA: Diagnosis not present

## 2021-12-08 DIAGNOSIS — R69 Illness, unspecified: Secondary | ICD-10-CM | POA: Diagnosis not present

## 2021-12-29 DIAGNOSIS — H6122 Impacted cerumen, left ear: Secondary | ICD-10-CM | POA: Diagnosis not present

## 2021-12-29 DIAGNOSIS — H9122 Sudden idiopathic hearing loss, left ear: Secondary | ICD-10-CM | POA: Diagnosis not present

## 2022-01-06 ENCOUNTER — Ambulatory Visit
Admission: RE | Admit: 2022-01-06 | Discharge: 2022-01-06 | Disposition: A | Discharge status: Home / Self Care | Payer: 59 | Source: Ambulatory Visit | Attending: Obstetrics & Gynecology | Admitting: Obstetrics & Gynecology

## 2022-01-06 DIAGNOSIS — Z1231 Encounter for screening mammogram for malignant neoplasm of breast: Secondary | ICD-10-CM | POA: Diagnosis not present

## 2022-01-07 ENCOUNTER — Other Ambulatory Visit: Payer: Self-pay | Admitting: Obstetrics & Gynecology

## 2022-01-10 ENCOUNTER — Other Ambulatory Visit: Payer: Self-pay | Admitting: Obstetrics & Gynecology

## 2022-01-10 DIAGNOSIS — N6489 Other specified disorders of breast: Secondary | ICD-10-CM

## 2022-01-10 DIAGNOSIS — R928 Other abnormal and inconclusive findings on diagnostic imaging of breast: Secondary | ICD-10-CM

## 2022-01-18 DIAGNOSIS — L309 Dermatitis, unspecified: Secondary | ICD-10-CM | POA: Diagnosis not present

## 2022-01-18 DIAGNOSIS — H9122 Sudden idiopathic hearing loss, left ear: Secondary | ICD-10-CM | POA: Diagnosis not present

## 2022-01-20 ENCOUNTER — Ambulatory Visit
Admission: RE | Admit: 2022-01-20 | Discharge: 2022-01-20 | Disposition: A | Discharge status: Home / Self Care | Payer: 59 | Source: Ambulatory Visit | Attending: Obstetrics & Gynecology | Admitting: Obstetrics & Gynecology

## 2022-01-20 DIAGNOSIS — R928 Other abnormal and inconclusive findings on diagnostic imaging of breast: Secondary | ICD-10-CM | POA: Diagnosis not present

## 2022-01-20 DIAGNOSIS — N6489 Other specified disorders of breast: Secondary | ICD-10-CM | POA: Insufficient documentation

## 2022-06-02 IMAGING — MG MM DIGITAL SCREENING BILAT W/ TOMO AND CAD
8 series · 8 of 24 positions shown · non-contrast
Comparison: Previous exam(s).

CLINICAL DATA: Screening.

EXAM:
DIGITAL SCREENING BILATERAL MAMMOGRAM WITH TOMOSYNTHESIS AND CAD
TECHNIQUE: Bilateral screening digital craniocaudal and mediolateral oblique
mammograms were obtained. Bilateral screening digital breast
tomosynthesis was performed. The images were evaluated with
computer-aided detection.

[R MLO synth-2D]
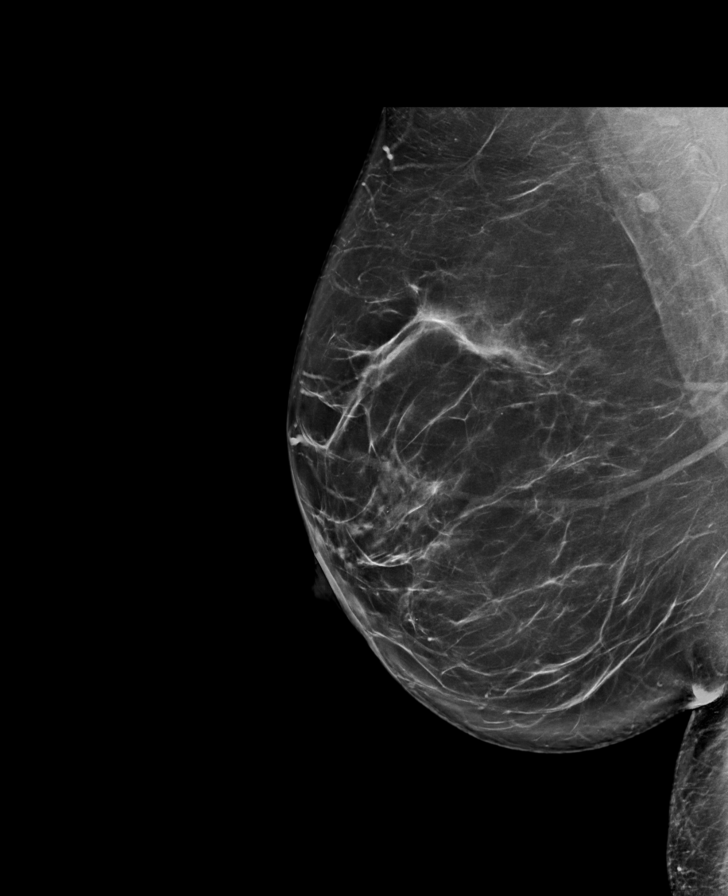

[R CC synth-2D]
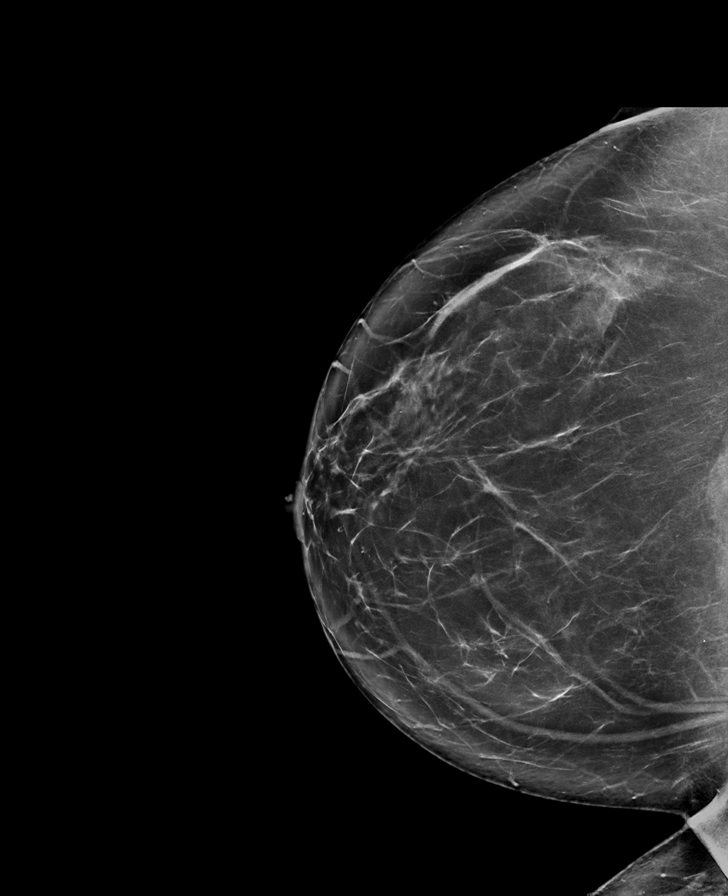

[L MLO synth-2D]
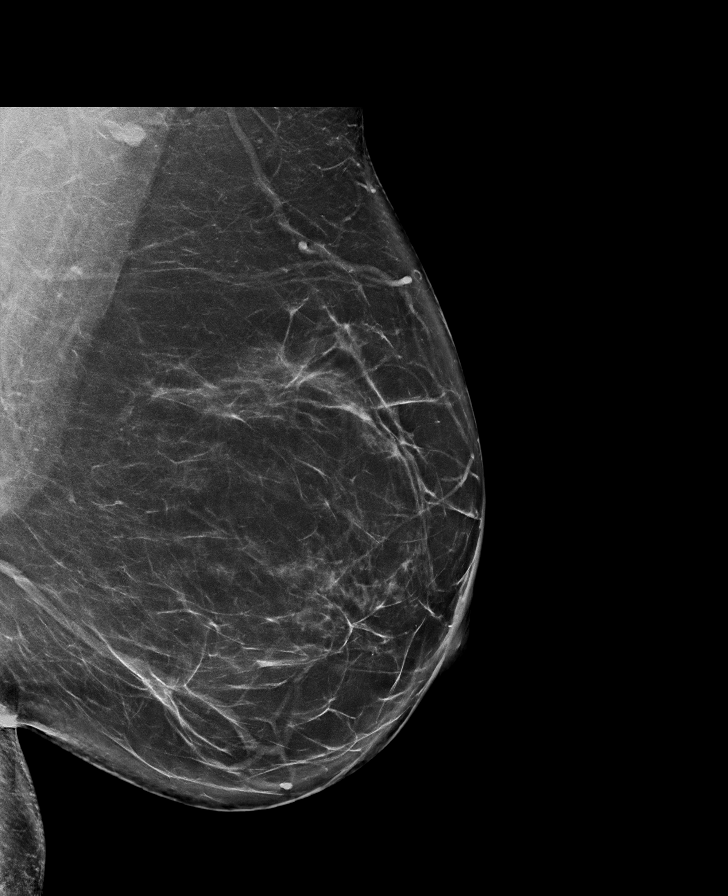

[L CC synth-2D]
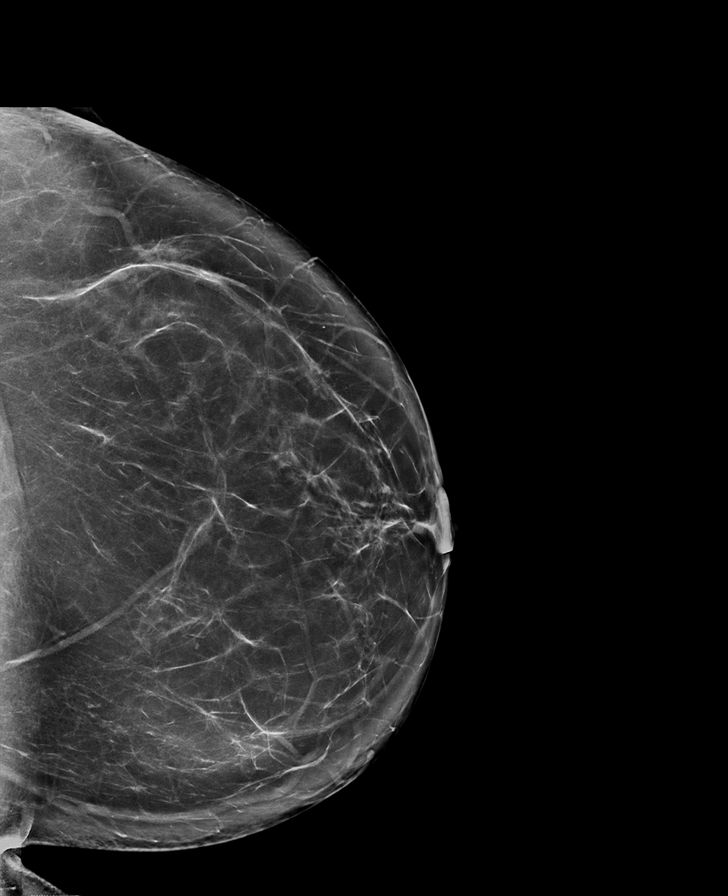

[L MLO tomo · tomo slice 48/95.0]
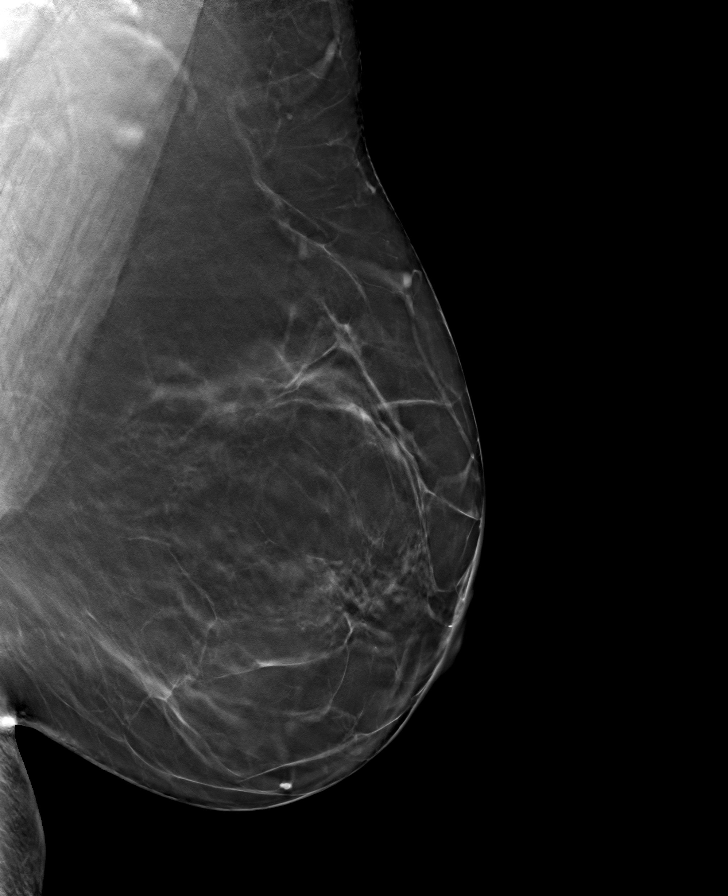

[L CC tomo · tomo slice 46/91.0]
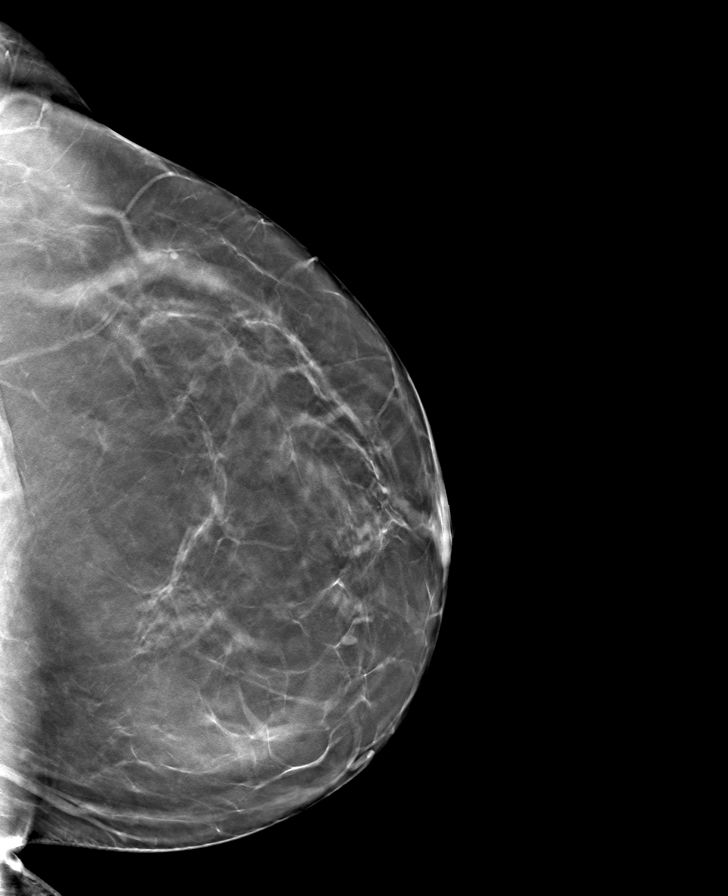

[R MLO tomo · tomo slice 47/93.0]
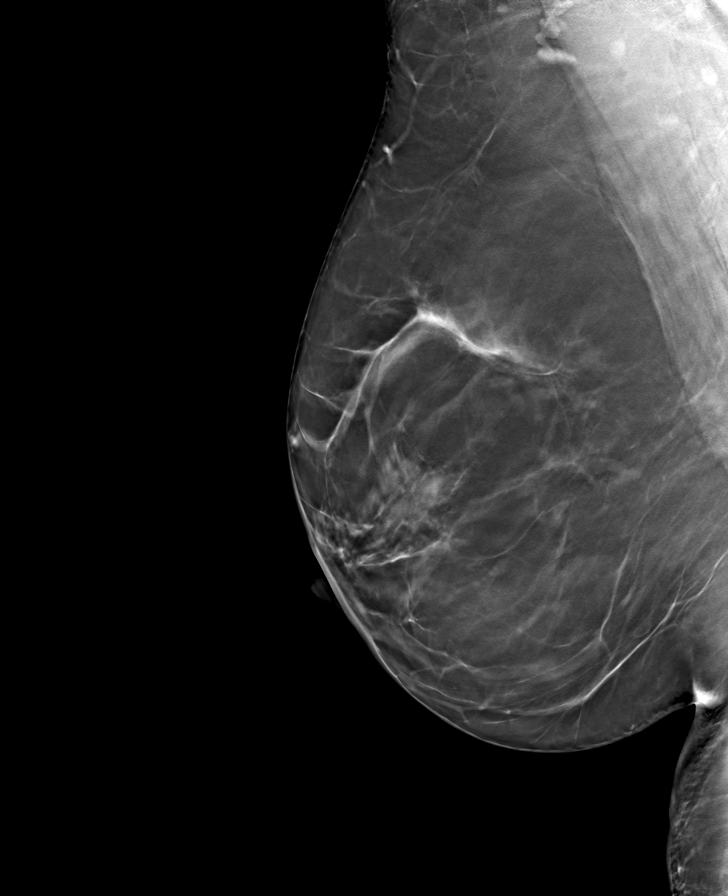

[R CC tomo · tomo slice 46/91.0]
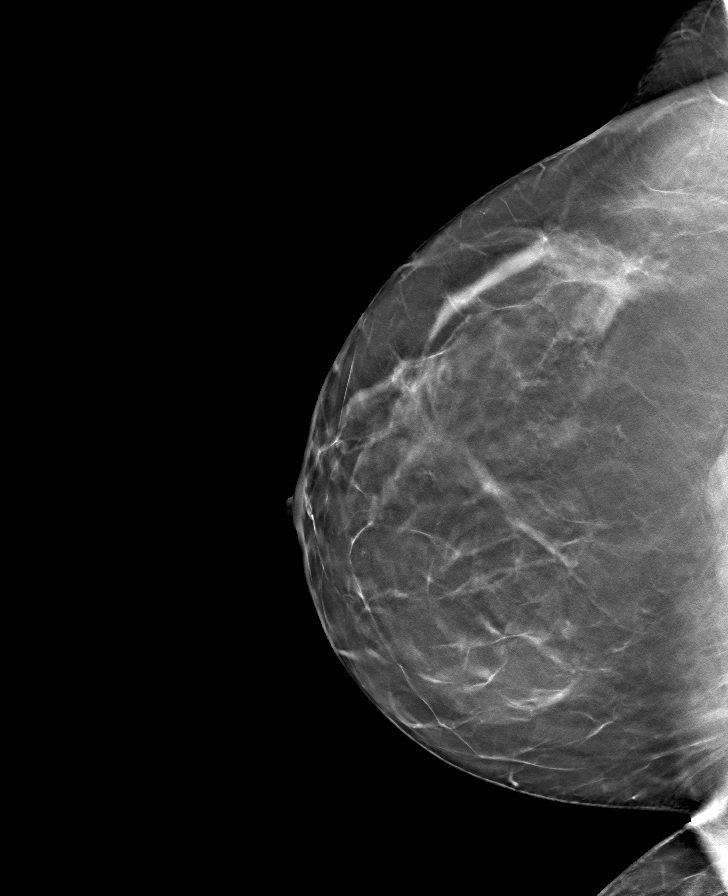

[8 of 24 positions shown; findings below may reference images not displayed]

ACR Breast Density Category b: There are scattered areas of
fibroglandular density.
FINDINGS: In the right breast, a possible asymmetry warrants further
evaluation. In the left breast, no findings suspicious for
malignancy.
IMPRESSION: Further evaluation is suggested for possible asymmetry in the right
breast.

RECOMMENDATION:
Diagnostic mammogram and possibly ultrasound of the right breast.
(Code:BU-C-IIK)

The patient will be contacted regarding the findings, and additional
imaging will be scheduled.

BI-RADS CATEGORY  0: Incomplete. Need additional imaging evaluation
and/or prior mammograms for comparison.

## 2023-12-13 ENCOUNTER — Ambulatory Visit
Admission: RE | Admit: 2023-12-13 | Discharge: 2023-12-13 | Disposition: A | Discharge status: Home / Self Care | Source: Ambulatory Visit | Attending: Family Medicine | Admitting: Family Medicine

## 2023-12-13 ENCOUNTER — Other Ambulatory Visit: Payer: Self-pay | Admitting: Family Medicine

## 2023-12-13 DIAGNOSIS — Z1231 Encounter for screening mammogram for malignant neoplasm of breast: Secondary | ICD-10-CM | POA: Diagnosis present
